# Patient Record
Sex: Male | Born: 1965 | Race: White | Hispanic: No | Marital: Married
Health system: Southern US, Community
[De-identification: ages and names within clinical notes are randomized; demographics above are authoritative.]

## PROBLEM LIST (undated history)

## (undated) DIAGNOSIS — I10 Essential (primary) hypertension: Secondary | ICD-10-CM

## (undated) DIAGNOSIS — E119 Type 2 diabetes mellitus without complications: Secondary | ICD-10-CM

---

## 2020-08-15 ENCOUNTER — Other Ambulatory Visit (HOSPITAL_COMMUNITY): Payer: Self-pay

## 2020-08-15 ENCOUNTER — Other Ambulatory Visit: Payer: Self-pay

## 2020-08-15 ENCOUNTER — Emergency Department (HOSPITAL_COMMUNITY): Payer: BC Managed Care – PPO

## 2020-08-15 ENCOUNTER — Encounter (HOSPITAL_COMMUNITY): Payer: Self-pay

## 2020-08-15 ENCOUNTER — Emergency Department (HOSPITAL_COMMUNITY)
Admission: EM | Admit: 2020-08-15 | Discharge: 2020-08-15 | Disposition: A | Discharge status: Home / Self Care | Payer: BC Managed Care – PPO | Attending: Emergency Medicine | Admitting: Emergency Medicine

## 2020-08-15 DIAGNOSIS — K2980 Duodenitis without bleeding: Secondary | ICD-10-CM | POA: Insufficient documentation

## 2020-08-15 DIAGNOSIS — Z20822 Contact with and (suspected) exposure to covid-19: Secondary | ICD-10-CM | POA: Diagnosis not present

## 2020-08-15 DIAGNOSIS — R1011 Right upper quadrant pain: Secondary | ICD-10-CM | POA: Diagnosis present

## 2020-08-15 DIAGNOSIS — E119 Type 2 diabetes mellitus without complications: Secondary | ICD-10-CM | POA: Diagnosis not present

## 2020-08-15 DIAGNOSIS — I10 Essential (primary) hypertension: Secondary | ICD-10-CM | POA: Insufficient documentation

## 2020-08-15 HISTORY — DX: Essential (primary) hypertension: I10

## 2020-08-15 HISTORY — DX: Type 2 diabetes mellitus without complications: E11.9

## 2020-08-15 LAB — COMPREHENSIVE METABOLIC PANEL
ALT: 41 U/L (ref 0–44)
AST: 19 U/L (ref 15–41)
Albumin: 3.8 g/dL (ref 3.5–5.0)
Alkaline Phosphatase: 35 U/L — ABNORMAL LOW (ref 38–126)
Anion gap: 11 (ref 5–15)
BUN: 18 mg/dL (ref 6–20)
CO2: 23 mmol/L (ref 22–32)
Calcium: 9.3 mg/dL (ref 8.9–10.3)
Chloride: 102 mmol/L (ref 98–111)
Creatinine, Ser: 1.13 mg/dL (ref 0.61–1.24)
GFR, Estimated: >60 mL/min (ref >60)
Glucose, Bld: 152 mg/dL — ABNORMAL HIGH (ref 70–99)
Potassium: 3.8 mmol/L (ref 3.5–5.1)
Sodium: 136 mmol/L (ref 135–145)
Total Bilirubin: 1 mg/dL (ref 0.3–1.2)
Total Protein: 7.3 g/dL (ref 6.5–8.1)

## 2020-08-15 LAB — CBC WITH DIFFERENTIAL/PLATELET
Abs Immature Granulocytes: 0.02 10*3/uL (ref 0.00–0.07)
Basophils Absolute: 0 10*3/uL (ref 0.0–0.1)
Basophils Relative: 0 %
Eosinophils Absolute: 0 10*3/uL (ref 0.0–0.5)
Eosinophils Relative: 0 %
HCT: 37.7 % — ABNORMAL LOW (ref 39.0–52.0)
Hemoglobin: 12.5 g/dL — ABNORMAL LOW (ref 13.0–17.0)
Immature Granulocytes: 0 %
Lymphocytes Relative: 20 %
Lymphs Abs: 1.8 10*3/uL (ref 0.7–4.0)
MCH: 28.5 pg (ref 26.0–34.0)
MCHC: 33.2 g/dL (ref 30.0–36.0)
MCV: 85.9 fL (ref 80.0–100.0)
Monocytes Absolute: 0.8 10*3/uL (ref 0.1–1.0)
Monocytes Relative: 8 %
Neutro Abs: 6.6 10*3/uL (ref 1.7–7.7)
Neutrophils Relative %: 72 %
Platelets: 236 10*3/uL (ref 150–400)
RBC: 4.39 MIL/uL (ref 4.22–5.81)
RDW: 13.4 % (ref 11.5–15.5)
WBC: 9.3 10*3/uL (ref 4.0–10.5)
nRBC: 0 % (ref 0.0–0.2)

## 2020-08-15 LAB — RESP PANEL BY RT-PCR (FLU A&B, COVID) ARPGX2
Influenza A by PCR: NEGATIVE
Influenza B by PCR: NEGATIVE
SARS Coronavirus 2 by RT PCR: NEGATIVE

## 2020-08-15 LAB — LIPASE, BLOOD: Lipase: 62 U/L — ABNORMAL HIGH (ref 11–51)

## 2020-08-15 MED ORDER — OXYCODONE-ACETAMINOPHEN 5-325 MG PO TABS
1.0000 | ORAL_TABLET | Freq: Four times a day (QID) | ORAL | 0 refills | Status: AC | PRN
  Filled 2020-08-15: qty 10, 3d supply, fill #0

## 2020-08-15 MED ORDER — PANTOPRAZOLE SODIUM 20 MG PO TBEC
20.0000 mg | DELAYED_RELEASE_TABLET | Freq: Every day | ORAL | 0 refills | Status: AC
  Filled 2020-08-15: qty 30, 30d supply, fill #0

## 2020-08-15 MED ORDER — ONDANSETRON 4 MG PO TBDP
4.0000 mg | ORAL_TABLET | Freq: Three times a day (TID) | ORAL | 0 refills | Status: AC | PRN
  Filled 2020-08-15: qty 20, 7d supply, fill #0

## 2020-08-15 MED ORDER — IOHEXOL 300 MG/ML  SOLN
100.0000 mL | Freq: Once | INTRAMUSCULAR | Status: AC | PRN
  Administered 2020-08-15: 100 mL via INTRAVENOUS

## 2020-08-15 MED ORDER — LIDOCAINE VISCOUS HCL 2 % MT SOLN
15.0000 mL | Freq: Once | OROMUCOSAL | Status: DC
  Filled 2020-08-15: qty 15

## 2020-08-15 MED ORDER — ONDANSETRON HCL 4 MG/2ML IJ SOLN
4.0000 mg | Freq: Once | INTRAMUSCULAR | Status: AC
  Administered 2020-08-15: 4 mg via INTRAVENOUS
  Filled 2020-08-15: qty 2

## 2020-08-15 MED ORDER — SODIUM CHLORIDE 0.9 % IV BOLUS
1000.0000 mL | Freq: Once | INTRAVENOUS | Status: AC
  Administered 2020-08-15: 1000 mL via INTRAVENOUS

## 2020-08-15 MED ORDER — MORPHINE SULFATE (PF) 4 MG/ML IV SOLN
4.0000 mg | Freq: Once | INTRAVENOUS | Status: AC
  Administered 2020-08-15: 4 mg via INTRAVENOUS
  Filled 2020-08-15: qty 1

## 2020-08-15 MED ORDER — ALUM & MAG HYDROXIDE-SIMETH 200-200-20 MG/5ML PO SUSP
30.0000 mL | Freq: Once | ORAL | Status: DC
  Filled 2020-08-15: qty 30

## 2020-08-15 MED ORDER — SUCRALFATE 1 G PO TABS
1.0000 g | ORAL_TABLET | Freq: Three times a day (TID) | ORAL | 0 refills | Status: AC
  Filled 2020-08-15: qty 40, 10d supply, fill #0

## 2020-08-15 NOTE — ED Triage Notes (Signed)
Pt reports RUQ pain x1 week. Pt denies N/V/D. Pt reports hx of gall stones.

## 2020-08-15 NOTE — Discharge Instructions (Signed)
Your imaging today showed duodenitis which is inflammation of the duodenum which is the first part of your small bowel.  It is possible you have a small ulcer.  There is no evidence of perforation which is encouraging.  I have written you for a few medications.  Take as prescribed.  Follow-up with emergency department locally if you have any additional concerns

## 2020-08-15 NOTE — ED Provider Notes (Signed)
[facility]-EMERGENCY DEPT Provider Note   CSN: [id number] Arrival date & time: 08/15/20  1116     History Chief Complaint  Patient presents with  . Abdominal Pain    Jose Barber is a 55 y.o. male with past medical history significant for diabetes, hypertension who presents for evaluation of right upper quadrant abdominal pain.  Pain began a few days ago.  Initially dull aching.  Ate yesterday evening and subsequently had worsening pain.  States he woke up this morning with chills and sweating.  Intermittent stabbing and aching to his right upper quadrant.  Does not radiate into his back.  No history of chronic EtOH use, NSAID use.   Ate some [employer] around 5.  He is able to keep down some small liquids however is worsening pain.  Pain currently 8/10 however when becomes stabbing becomes a 10/10.  Pain does not go into his flank.  No nausea or vomiting. No hx of AAA, dissection. States 1 year ago they did an ultrasound of his right upper quadrant and noted to have a possible gallstone "attached to the wall."  He is supposed to follow-up in 1 year which would be around this time.  No anticoagulation.  No prior abdominal surgeries.  Denies fever, headache, lightheadedness, dizziness, chest pain, shortness of breath, cough, dysuria, diarrhea, constipation.  No paresthesias.  Denies additional rating or alleviating factors.  Patient is here visiting from out of town.  He works for a [employer], [employer].  He is only here until 9 PM this evening. Not followed by GI at home base. No melena or BRBPR.  History obtained from patient and past medical records.  No interpreter used.  HPI     Past Medical History:  Diagnosis Date  . Diabetes mellitus without complication (HCC)   . Hypertension     There are no problems to display for this patient.   History reviewed. No pertinent surgical history.     History reviewed. No pertinent family history.     Home  Medications Prior to Admission medications   Medication Sig Start Date End Date Taking? Authorizing Provider  ondansetron (ZOFRAN ODT) 4 MG disintegrating tablet Dissolve 1 tablet (4 mg total) by mouth every 8 (eight) hours as needed for nausea or vomiting. 08/15/20  Yes Balbina Depace A, PA-C  oxyCODONE-acetaminophen (PERCOCET/ROXICET) 5-325 MG tablet Take 1 tablet by mouth every 6 (six) hours as needed for severe pain. 08/15/20  Yes Ulyssa Walthour A, PA-C  pantoprazole (PROTONIX) 20 MG tablet Take 1 tablet (20 mg total) by mouth daily. 08/15/20 09/14/20 Yes Marynell Bies A, PA-C  sucralfate (CARAFATE) 1 g tablet Take 1 tablet (1 g total) by mouth 4 (four) times daily -  with meals and at bedtime for 10 days. 08/15/20 08/25/20 Yes Jochebed Bills A, PA-C    Allergies    Patient has no known allergies.  Review of Systems   Review of Systems  Constitutional: Negative.   HENT: Negative.   Respiratory: Negative.   Cardiovascular: Negative.   Gastrointestinal: Positive for abdominal pain. Negative for constipation, diarrhea, nausea, rectal pain and vomiting.  Genitourinary: Negative.   Musculoskeletal: Negative.   Skin: Negative.   Neurological: Negative.   All other systems reviewed and are negative.   Physical Exam Updated Vital Signs BP 126/83   Pulse 81   Temp 98.2 F (36.8 C) (Oral)   Resp 15   SpO2 98%   Physical Exam Vitals and nursing note reviewed.  Constitutional:  General: He is not in acute distress.    Appearance: He is well-developed. He is not ill-appearing, toxic-appearing or diaphoretic.  HENT:     Head: Normocephalic and atraumatic.     Mouth/Throat:     Mouth: Mucous membranes are moist.  Eyes:     Pupils: Pupils are equal, round, and reactive to light.  Cardiovascular:     Rate and Rhythm: Normal rate and regular rhythm.     Heart sounds: Normal heart sounds.  Pulmonary:     Effort: Pulmonary effort is normal. No respiratory distress.     Breath  sounds: Normal breath sounds.     Comments: Clear to auscultation bilaterally.  Speaks in full sentences without difficulty Abdominal:     General: Bowel sounds are normal. There is no distension.     Palpations: Abdomen is soft.     Tenderness: There is abdominal tenderness in the right upper quadrant and epigastric area. There is guarding. There is no right CVA tenderness, left CVA tenderness or rebound.     Hernia: No hernia is present.  Musculoskeletal:        General: Normal range of motion.     Cervical back: Normal range of motion and neck supple.  Skin:    General: Skin is warm and dry.  Neurological:     General: No focal deficit present.     Mental Status: He is alert.     ED Results / Procedures / Treatments   Labs (all labs ordered are listed, but only abnormal results are displayed) Labs Reviewed  CBC WITH DIFFERENTIAL/PLATELET - Abnormal; Notable for the following components:      Result Value   Hemoglobin 12.5 (*)    HCT 37.7 (*)    All other components within normal limits  COMPREHENSIVE METABOLIC PANEL - Abnormal; Notable for the following components:   Glucose, Bld 152 (*)    Alkaline Phosphatase 35 (*)    All other components within normal limits  LIPASE, BLOOD - Abnormal; Notable for the following components:   Lipase 62 (*)    All other components within normal limits  RESP PANEL BY RT-PCR (FLU A&B, COVID) [id number]    EKG EKG Interpretation  Date/Time:  Friday August 15 2020 11:47:43 EDT Ventricular Rate:  84 PR Interval:  155 QRS Duration: 90 QT Interval:  338 QTC Calculation: 400 R Axis:   11 Text Interpretation: Sinus rhythm no acute ST/T changes No old tracing to compare Confirmed by Pricilla Loveless [phone]) on 08/15/2020 12:20:45 PM   Radiology CT Abdomen Pelvis W Contrast  Result Date: 08/15/2020 CLINICAL DATA:  Epigastric pain. Right upper quadrant pain for 1 week. EXAM: CT ABDOMEN AND PELVIS WITH CONTRAST TECHNIQUE: Multidetector CT imaging  of the abdomen and pelvis was performed using the standard protocol following bolus administration of intravenous contrast. CONTRAST:  OMNIPAQUE IOHEXOL 300 MG/ML  SOLN COMPARISON:  None. FINDINGS: Lower chest: Unremarkable. Hepatobiliary: No suspicious focal abnormality within the liver parenchyma. There is no evidence for gallstones, gallbladder wall thickening, or pericholecystic fluid. No intrahepatic or extrahepatic biliary dilation. Pancreas: No focal mass lesion. No dilatation of the main duct. No intraparenchymal cyst. No peripancreatic edema. Spleen: No splenomegaly. No focal mass lesion. Adrenals/Urinary Tract: No adrenal nodule or mass. Kidneys unremarkable. No evidence for hydroureter. The urinary bladder appears normal for the degree of distention. Stomach/Bowel: Stomach is unremarkable. No gastric wall thickening. No evidence of outlet obstruction. Low-attenuation wall thickening noted in the duodenum, mainly involving the distal  second and third portion. This is associated with para duodenal edema/inflammation. There is some tracking of edema into the adjacent retroperitoneal fat. Small bowel otherwise unremarkable. The terminal ileum is normal. The appendix is normal. No gross colonic mass. No colonic wall thickening. Vascular/Lymphatic: No abdominal aortic aneurysm. No substantial atherosclerotic disease in the abdominal aorta. Portal vein and superior mesenteric vein are patent. Celiac axis, SMA, and IMA are widely patent. Reproductive: The prostate gland and seminal vesicles are unremarkable. Other: No intraperitoneal free fluid. Musculoskeletal: No worrisome lytic or sclerotic osseous abnormality. IMPRESSION: Wall thickening and mural edema in the duodenum with para duodenal edema/inflammation. Imaging features are compatible with duodenitis. No ulcer crater evident but duodenal ulcer with inflammation would also be a consideration. Esophageal neoplasm considered unlikely by imaging but not  excluded. Pancreas does not appear to be the etiology of the retroperitoneal edema/inflammation seen on today's study making acute pancreatitis less likely as an etiology. No evidence for perforation or abscess. Electronically Signed   By: Kennith Center M.D.   On: 08/15/2020 15:47   US Abdomen Limited RUQ (LIVER/GB)  Result Date: 08/15/2020 CLINICAL DATA:  One day of right upper quadrant pain EXAM: ULTRASOUND ABDOMEN LIMITED RIGHT UPPER QUADRANT COMPARISON:  None. FINDINGS: Gallbladder: No gallstones or wall thickening visualized. 5 mm gallbladder polyp. No sonographic Murphy sign noted by sonographer. Common bile duct: Diameter: 2 mm Liver: No focal lesion identified. Diffusely increased parenchymal echogenicity. Portal vein is patent on color Doppler imaging with normal direction of blood flow towards the liver. Other: None. IMPRESSION: 1. The echogenicity of the liver is increased. This is a nonspecific finding but is most commonly seen with fatty infiltration of the liver. There are no obvious focal liver lesions. 2. 5 mm gallbladder polyp. No further evaluation or follow up necessary. Per consensus guidelines, this requires no additional evaluation or specific follow-up. This recommendation follows ACR consensus guidelines: White Paper of the ACR Incidental findings Committee II on Gallbladder and Biliary Findings. J Am Coll Radiol 2013:;10:953-956. Electronically Signed   By: Maudry Mayhew MD   On: 08/15/2020 13:34    Procedures Procedures   Medications Ordered in ED Medications  alum & mag hydroxide-simeth (MAALOX/MYLANTA) 200-200-20 MG/5ML suspension 30 mL (has no administration in time range)    And  lidocaine (XYLOCAINE) 2 % viscous mouth solution 15 mL (has no administration in time range)  ondansetron (ZOFRAN) injection 4 mg (4 mg Intravenous Given 08/15/20 1201)  sodium chloride 0.9 % bolus 1,000 mL (0 mLs Intravenous Stopped 08/15/20 1345)  morphine 4 MG/ML injection 4 mg (4 mg Intravenous  Given 08/15/20 1202)  iohexol (OMNIPAQUE) 300 MG/ML solution 100 mL (100 mLs Intravenous Contrast Given 08/15/20 1506)  morphine 4 MG/ML injection 4 mg (4 mg Intravenous Given 08/15/20 1521)    ED Course  I have reviewed the triage vital signs and the nursing notes.  Pertinent labs & imaging results that were available during my care of the patient were reviewed by me and considered in my medical decision making (see chart for details).  Patient here for evaluation of right upper quadrant abdominal pain.  On arrival he is afebrile, nonseptic appearing.  He has tenderness to his right upper quadrant with guarding.  Heart and lungs clear.  No upper respiratory complaints.  Compartments soft.  No clinical evidence of DVT on exam.  Labs and imaging personally reviewed and interpreted:  CBC without leukocytosis Metabolic panel glucose 132,electrolyte, renal Lipase 62 Covid negative Ultrasound without evidence of gallbladder  wall thickening, CBD duct dilation.  Does have gallbladder polyp. CT abdomen pelvis does show likely duodenitis, no evidence of perforation.  Cannot rule out non perf ulcer. EKG without ischemic changes US RUQ with polyp. No cholelithiasis, cholecystitis, cholangitis.  Patient reassessed.  Pain improved however has returned.  Will give additional IV medication, discuss getting CT scan given ultrasound without any significant findings  Patient reassessed.  Pain controlled.  Tolerating p.o. intake.  Discussed remaining labs and imaging, likely duodenitis diagnosis.  DC home with symptomatic management.  He is not from this area.  Close follow-up with GI which she is agreeable for.  Patient is nontoxic, nonseptic appearing, in no apparent distress.  Patient's pain and other symptoms adequately managed in emergency department.  Fluid bolus given.  Labs, imaging and vitals reviewed.  Patient does not meet the SIRS or Sepsis criteria.  On repeat exam patient does not have a surgical  abdomin and there are no peritoneal signs.  No indication of appendicitis, bowel obstruction, bowel perforation, cholecystitis, diverticulitis.  Patient discharged home with symptomatic treatment and given strict instructions for follow-up with their primary care physician.  I have also discussed reasons to return immediately to the ER.  Patient expresses understanding and agrees with plan.  The patient has been appropriately medically screened and/or stabilized in the ED. I have low suspicion for any other emergent medical condition which would require further screening, evaluation or treatment in the ED or require inpatient management.  Patient is hemodynamically stable and in no acute distress.  Patient able to ambulate in department prior to ED.  Evaluation does not show acute pathology that would require ongoing or additional emergent interventions while in the emergency department or further inpatient treatment.  I have discussed the diagnosis with the patient and answered all questions.  Pain is been managed while in the emergency department and patient has no further complaints prior to discharge.  Patient is comfortable with plan discussed in room and is stable for discharge at this time.  I have discussed strict return precautions for returning to the emergency department.  Patient was encouraged to follow-up with PCP/specialist refer to at discharge.      MDM Rules/Calculators/A&P                           Final Clinical Impression(s) / ED Diagnoses Final diagnoses:  RUQ pain  Duodenitis    Rx / DC Orders ED Discharge Orders         Ordered    pantoprazole (PROTONIX) 20 MG tablet  Daily        08/15/20 1703    sucralfate (CARAFATE) 1 g tablet  3 times daily with meals & bedtime        08/15/20 1703    ondansetron (ZOFRAN ODT) 4 MG disintegrating tablet  Every 8 hours PRN        08/15/20 1703    oxyCODONE-acetaminophen (PERCOCET/ROXICET) 5-325 MG tablet  Every 6 hours PRN         08/15/20 1703           Carrolyn Hilmes A, PA-C 08/15/20 1726    Pricilla LovelessGoldston, Scott, MD 08/15/20 1823

## 2021-08-16 IMAGING — CT CT ABD-PELV W/ CM
2 of 5 series · 16 of 46 positions shown, 18 images · IV contrast (OMNIPAQUE 300)
Comparison: None.

CLINICAL DATA: Epigastric pain. Right upper quadrant pain for 1
week.

EXAM:
CT ABDOMEN AND PELVIS WITH CONTRAST
TECHNIQUE: Multidetector CT imaging of the abdomen and pelvis was performed
using the standard protocol following bolus administration of
intravenous contrast.
CONTRAST:  100mL OMNIPAQUE IOHEXOL 300 MG/ML  SOLN

[Series 2: axial st · axial · 0.84mm/px · z∈[+1207,+1607]mm · 13 of 94 slices shown, 15 images]
[im 7/94  soft-tissue]
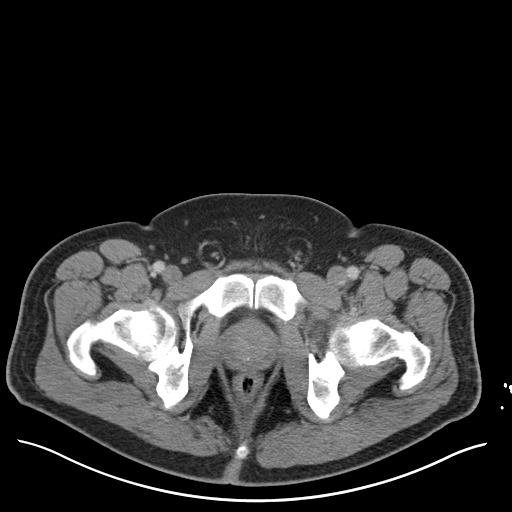
[im 7/94  bone]
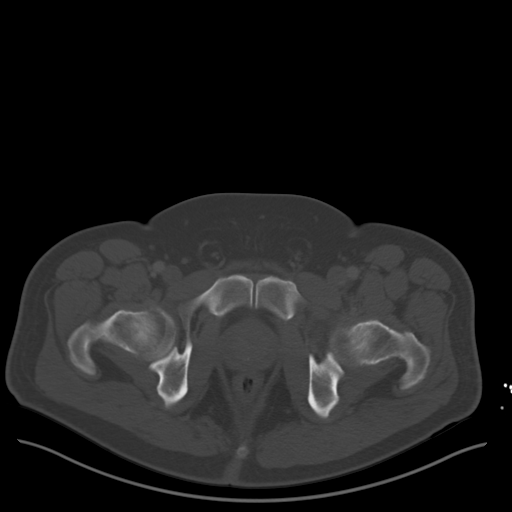
[im 13/94  soft-tissue]
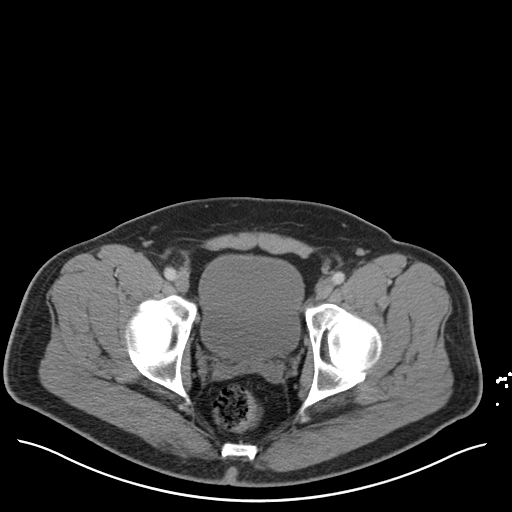
[im 19/94  soft-tissue]
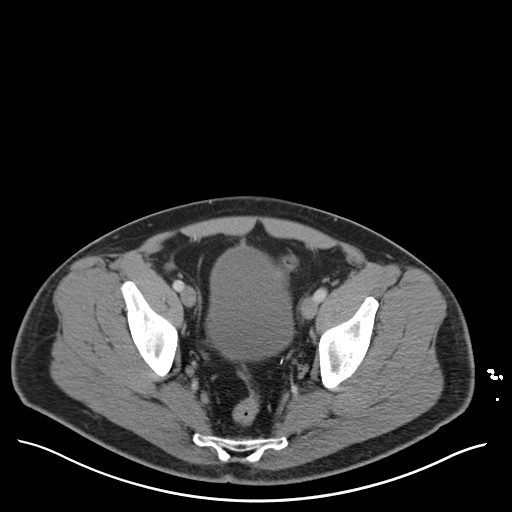
[im 25/94  soft-tissue]
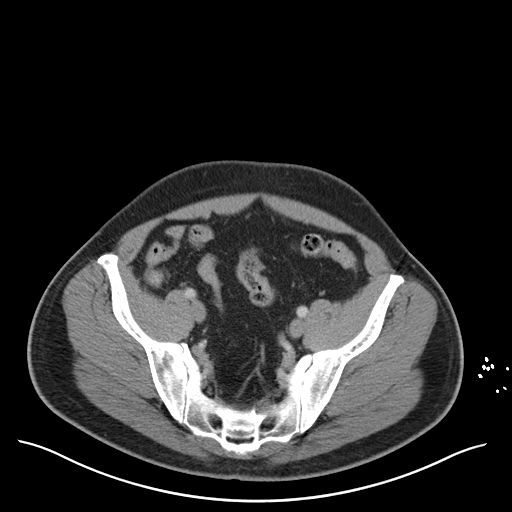
[im 32/94  soft-tissue]
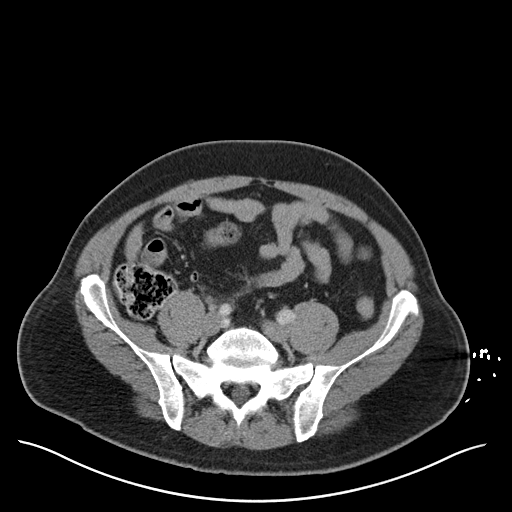
[im 38/94  soft-tissue]
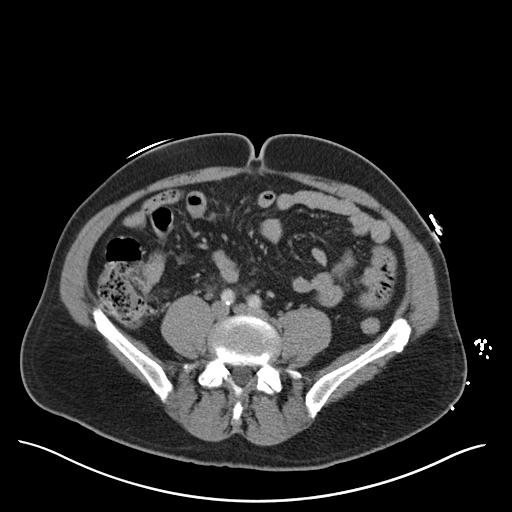
[im 50/94  soft-tissue]
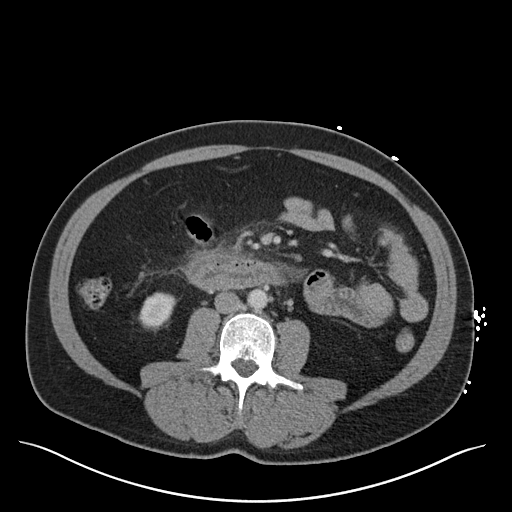
[im 56/94  soft-tissue]
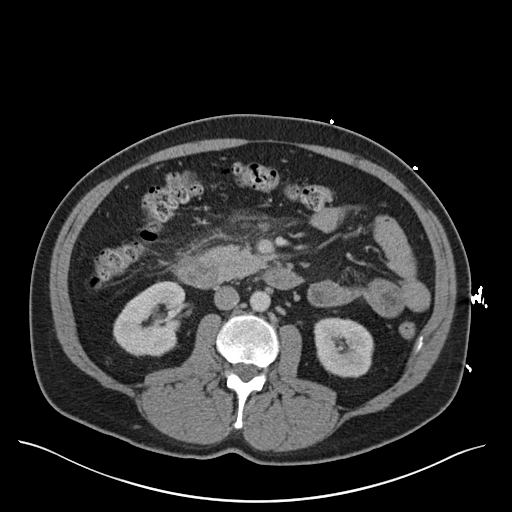
[im 63/94  soft-tissue]
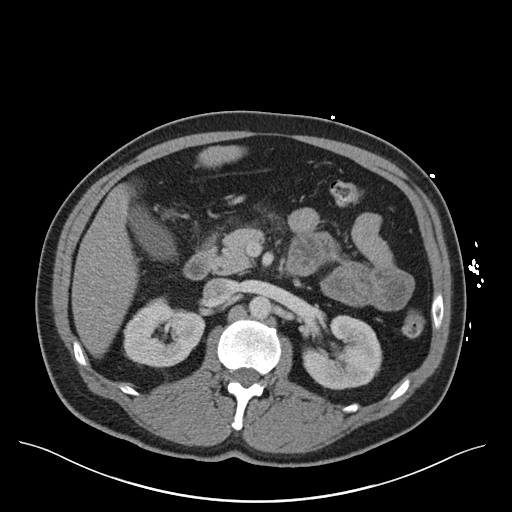
[im 63/94  bone]
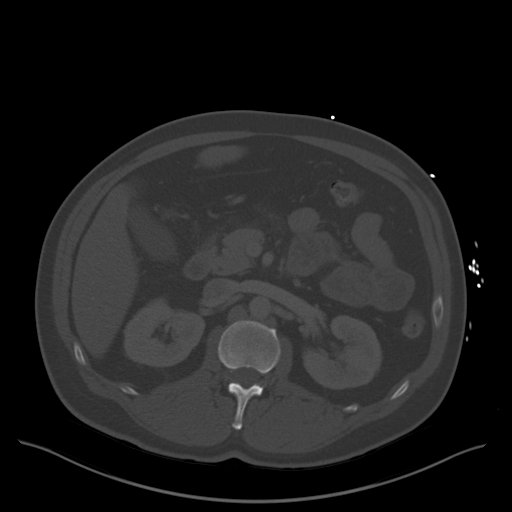
[im 69/94  soft-tissue]
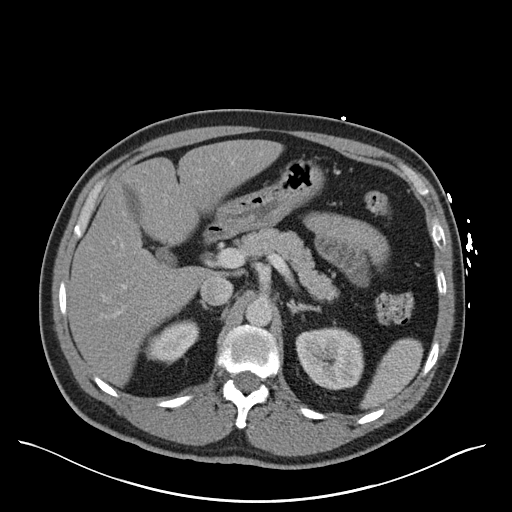
[im 75/94  soft-tissue]
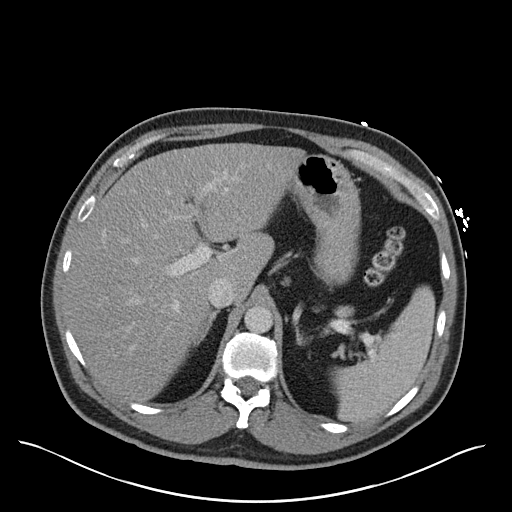
[im 81/94  soft-tissue]
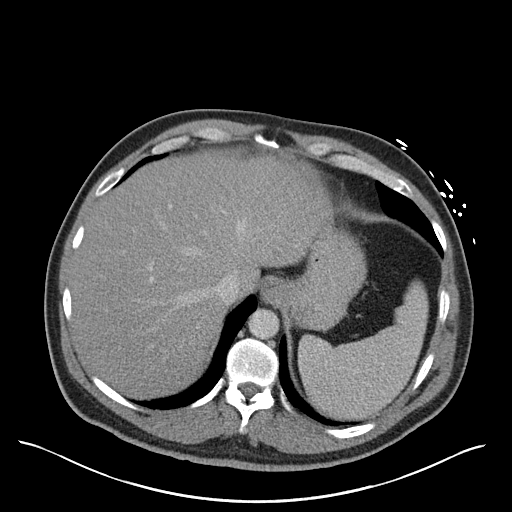
[im 87/94  soft-tissue]
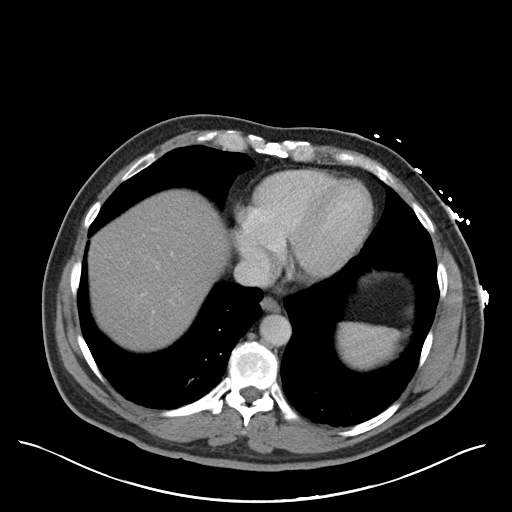

[Series 4: coronal st · coronal · 0.96mm/px · 3 of 142 slices shown]
[im 48/142  soft-tissue]
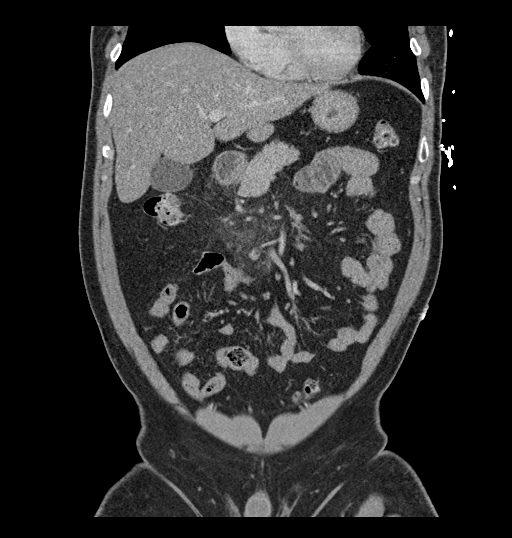
[im 63/142  soft-tissue]
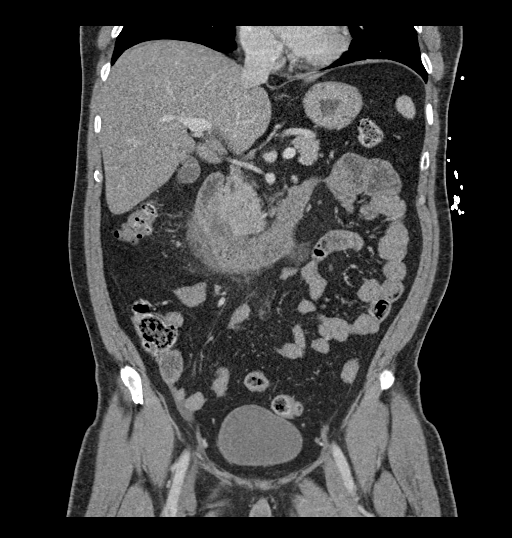
[im 79/142  soft-tissue]
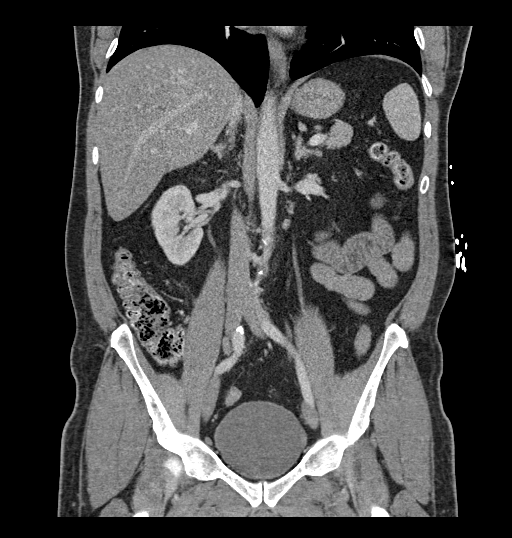

[16 of 46 positions shown; findings below may reference images not displayed]

FINDINGS: Lower chest: Unremarkable.

Hepatobiliary: No suspicious focal abnormality within the liver
parenchyma. There is no evidence for gallstones, gallbladder wall
thickening, or pericholecystic fluid. No intrahepatic or
extrahepatic biliary dilation.

Pancreas: No focal mass lesion. No dilatation of the main duct. No
intraparenchymal cyst. No peripancreatic edema.

Spleen: No splenomegaly. No focal mass lesion.

Adrenals/Urinary Tract: No adrenal nodule or mass. Kidneys
unremarkable. No evidence for hydroureter. The urinary bladder
appears normal for the degree of distention.

Stomach/Bowel: Stomach is unremarkable. No gastric wall thickening.
No evidence of outlet obstruction. Low-attenuation wall thickening
noted in the duodenum, mainly involving the distal second and third
portion. This is associated with para duodenal edema/inflammation.
There is some tracking of edema into the adjacent retroperitoneal
fat. Small bowel otherwise unremarkable. The terminal ileum is
normal. The appendix is normal. No gross colonic mass. No colonic
wall thickening.

Vascular/Lymphatic: No abdominal aortic aneurysm. No substantial
atherosclerotic disease in the abdominal aorta. Portal vein and
superior mesenteric vein are patent. Celiac axis, SMA, and IMA are
widely patent.

Reproductive: The prostate gland and seminal vesicles are
unremarkable.

Other: No intraperitoneal free fluid.

Musculoskeletal: No worrisome lytic or sclerotic osseous
abnormality.
IMPRESSION: Wall thickening and mural edema in the duodenum with para duodenal
edema/inflammation. Imaging features are compatible with duodenitis.
No ulcer crater evident but duodenal ulcer with inflammation would
also be a consideration. Esophageal neoplasm considered unlikely by
imaging but not excluded. Pancreas does not appear to be the
etiology of the retroperitoneal edema/inflammation seen on today's
study making acute pancreatitis less likely as an etiology. No
evidence for perforation or abscess.
